# Patient Record
Sex: Female | Born: 2005 | Race: Black or African American | Hispanic: No | Marital: Single | Smoking: Never smoker
Health system: Southern US, Community
[De-identification: ages and names within clinical notes are randomized; demographics above are authoritative.]

## PROBLEM LIST (undated history)

## (undated) DIAGNOSIS — H9190 Unspecified hearing loss, unspecified ear: Secondary | ICD-10-CM

## (undated) DIAGNOSIS — H905 Unspecified sensorineural hearing loss: Secondary | ICD-10-CM

## (undated) DIAGNOSIS — F419 Anxiety disorder, unspecified: Secondary | ICD-10-CM

## (undated) DIAGNOSIS — J45909 Unspecified asthma, uncomplicated: Secondary | ICD-10-CM

## (undated) HISTORY — DX: Anxiety disorder, unspecified: F41.9

---
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
## 2023-03-17 ENCOUNTER — Ambulatory Visit: Payer: 59 | Admitting: Family

## 2023-03-17 ENCOUNTER — Encounter: Payer: Self-pay | Admitting: Family

## 2023-03-17 VITALS — BP 119/78 | HR 94 | Temp 98.0°F | Ht 65.0 in | Wt 171.4 lb

## 2023-03-17 DIAGNOSIS — N926 Irregular menstruation, unspecified: Secondary | ICD-10-CM

## 2023-03-17 DIAGNOSIS — J4520 Mild intermittent asthma, uncomplicated: Secondary | ICD-10-CM | POA: Insufficient documentation

## 2023-03-17 DIAGNOSIS — H905 Unspecified sensorineural hearing loss: Secondary | ICD-10-CM | POA: Insufficient documentation

## 2023-03-17 NOTE — Progress Notes (Signed)
New Patient Office Visit  Subjective:  Patient ID: Sandra Mccall, female    DOB: 12/14/2005  Age: 17 y.o. MRN: [id number]  CC:  Chief Complaint  Patient presents with   New Patient (Initial Visit)   Menstrual Problem    Pt c/o spaced out cycles,Present since the beginning of the year. Pt would like to discuss PCOS sx.    HPI Sandra Mccall presents for establishing care today.  *Discussed the use of AI scribe software for clinical note transcription with the patient, who gave verbal consent to proceed.  History of Present Illness   The patient, a junior at [employer], presents to establish care, and with concerns about irregular menstrual cycles and weight fluctuations. She reports that her menstrual cycle has been irregular since the beginning of the year, with periods arriving late or skipping a month. She also notes a fluctuation in weight, losing and gaining the same five pounds. The patient mentions experiencing facial hair growth under her chin, which she believes could be related to PCOS. She reports never being on birth control in past, has only had one sexual encounter and used condom, no hx of PAP, has had HPV vaccines.  In addition to these concerns, the patient has a history of auditory processing disorder since about 1st grade and has been using hearing aids since around the seventh grade. The hearing loss is described as a sensory issue, not related to infections or family history. The patient reports that she can hear but has difficulty processing certain sounds, and the hearing aides help a lot but she can still struggle at times with certain voices, especially males.  She is considering upgrading to Bluetooth-enabled hearing aids in the future.  The patient also has a history of asthma but reports no severe incidents. She uses a rescue inhaler as needed, with pollen identified as a trigger, denies cold or hot weather as triggers.      Assessment & Plan:      Irregular Menstrual Cycles - Recent irregularity in menstrual cycles with no associated pain or heavy bleeding. Possible Polycystic Ovary Syndrome (PCOS) suggested due to irregular cycles and facial hair growth & weight fluctuations.  -Reviewed PCOS s/s, dx, testing and treatment briefly, handout provided for dietary changes. -Refer to gynecology for further evaluation and possible workup.  -Can discuss birth control also if needed.  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 No recent changes or concerns. -Continue current management with hearing aids.  Asthma - Mild, with pollen identified as a trigger. No recent exacerbations. -Continue use of rescue inhaler as needed, no refill needed today.  General Health Maintenance -Encourage regular exercise, considering interval walking for cardiovascular health. -Continue monitoring for any changes in health status.    Subjective:    Outpatient Medications Prior to Visit  Medication Sig Dispense Refill   albuterol (VENTOLIN HFA) 108 (90 Base) MCG/ACT inhaler Inhale into the lungs.     No facility-administered medications prior to visit.   Past Medical History:  Diagnosis Date   Anxiety    Asthma    History reviewed. No pertinent surgical history.  Objective:   Today's Vitals: BP 119/78 (BP Location: Right Arm)   Pulse 94   Temp 98 F (36.7 C) (Temporal)   Ht 5\' 5"  (1.651 m)   Wt 171 lb 6 oz (77.7 kg)   LMP 02/22/2023 (Exact Date)   SpO2 99%   BMI 28.52 kg/m  Physical Exam Vitals and nursing note reviewed.  Constitutional:      Appearance: Normal appearance.  Cardiovascular:     Rate and Rhythm: Normal rate and regular rhythm.  Pulmonary:     Effort: Pulmonary effort is normal.     Breath sounds: Normal breath sounds.  Musculoskeletal:        General: Normal range of motion.  Skin:    General: Skin is warm and dry.  Neurological:     Mental Status: She  is alert.  Psychiatric:        Mood and Affect: Mood normal.        Behavior: Behavior normal.     No orders of the defined types were placed in this encounter.   Dulce Sellar, NP

## 2023-03-17 NOTE — Patient Instructions (Addendum)
Welcome to [facility], It was a pleasure meeting you today!   I have sent a referral to GYN to talk about PCOS in more detail. I did attach to this AVS some info on diet to treat PCOS which is good whether you actually have the condition or not!  Have a great week!    PLEASE NOTE: If you had any LAB tests please let us know if you have not heard back within a few days. You may see your results on MyChart before we have a chance to review them but we will give you a call once they are reviewed by [place]. If we ordered any REFERRALS today, please let us know if you have not heard from their office within the next week.  Let us know through MyChart if you are needing REFILLS, or have your pharmacy send [place] the request. You can also use MyChart to communicate with me or any office staff.  Please try these tips to maintain a healthy lifestyle: It is important that you exercise regularly at least 30 minutes 5 times a week. Think about what you will eat, plan ahead. Choose whole foods, & think  "clean, green, fresh or frozen" over canned, processed or packaged foods which are more sugary, salty, and fatty. 70 to 75% of food eaten should be fresh vegetables and protein. 2-3  meals daily with healthy snacks between meals, but must be whole fruit, protein or vegetables. Aim to eat over a 10 hour period when you are active, for example, 7am to 5pm, and then STOP after your last meal of the day, drinking only water.  Shorter eating windows, 6-8 hours, are showing benefits in heart disease and blood sugar regulation. Drink water every day! Shoot for 64 ounces daily = 8 cups, no other drink is as healthy! Fruit juice is best enjoyed in a healthy way, by EATING the fruit.

## 2023-05-27 ENCOUNTER — Telehealth: Payer: Self-pay | Admitting: *Deleted

## 2023-05-27 NOTE — Telephone Encounter (Signed)
Left patient a message with detailed appointment information.

## 2023-05-31 ENCOUNTER — Encounter: Payer: 59 | Admitting: Obstetrics and Gynecology

## 2023-08-30 ENCOUNTER — Encounter (HOSPITAL_COMMUNITY): Payer: Self-pay

## 2023-08-30 ENCOUNTER — Ambulatory Visit (HOSPITAL_COMMUNITY)
Admission: RE | Admit: 2023-08-30 | Discharge: 2023-08-30 | Disposition: A | Discharge status: Home / Self Care | Source: Ambulatory Visit | Attending: Family Medicine | Admitting: Family Medicine

## 2023-08-30 VITALS — BP 136/88 | HR 80 | Temp 99.2°F | Resp 16 | Ht 65.0 in | Wt 160.0 lb

## 2023-08-30 DIAGNOSIS — Z113 Encounter for screening for infections with a predominantly sexual mode of transmission: Secondary | ICD-10-CM | POA: Diagnosis present

## 2023-08-30 DIAGNOSIS — Z7251 High risk heterosexual behavior: Secondary | ICD-10-CM

## 2023-08-30 LAB — POCT URINE PREGNANCY: Preg Test, Ur: NEGATIVE

## 2023-08-30 LAB — HIV ANTIBODY (ROUTINE TESTING W REFLEX): HIV Screen 4th Generation wRfx: NONREACTIVE
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
## 2023-08-31 LAB — CERVICOVAGINAL ANCILLARY ONLY
Bacterial Vaginitis (gardnerella): NEGATIVE
Candida Glabrata: NEGATIVE
Candida Vaginitis: NEGATIVE
Chlamydia: NEGATIVE
Comment: NEGATIVE
Comment: NEGATIVE
Comment: NEGATIVE
Comment: NEGATIVE
Comment: NEGATIVE
Comment: NORMAL
Neisseria Gonorrhea: NEGATIVE
Trichomonas: NEGATIVE

## 2023-08-31 LAB — RPR: RPR Ser Ql: NONREACTIVE

## 2023-10-25 ENCOUNTER — Emergency Department (HOSPITAL_COMMUNITY)

## 2023-10-25 ENCOUNTER — Emergency Department (HOSPITAL_COMMUNITY)
Admission: EM | Admit: 2023-10-25 | Discharge: 2023-10-25 | Discharge status: Against Medical Advice | Attending: Emergency Medicine | Admitting: Emergency Medicine

## 2023-10-25 ENCOUNTER — Other Ambulatory Visit: Payer: Self-pay

## 2023-10-25 DIAGNOSIS — R202 Paresthesia of skin: Secondary | ICD-10-CM | POA: Diagnosis not present

## 2023-10-25 DIAGNOSIS — Y900 Blood alcohol level of less than 20 mg/100 ml: Secondary | ICD-10-CM | POA: Diagnosis not present

## 2023-10-25 DIAGNOSIS — Z5321 Procedure and treatment not carried out due to patient leaving prior to being seen by health care provider: Secondary | ICD-10-CM | POA: Diagnosis not present

## 2023-10-25 DIAGNOSIS — R531 Weakness: Secondary | ICD-10-CM | POA: Diagnosis not present

## 2023-10-25 DIAGNOSIS — M79642 Pain in left hand: Secondary | ICD-10-CM | POA: Diagnosis present

## 2023-10-25 DIAGNOSIS — R200 Anesthesia of skin: Secondary | ICD-10-CM | POA: Insufficient documentation

## 2023-10-25 LAB — ETHANOL: Alcohol, Ethyl (B): <15 mg/dL (ref <15)

## 2023-10-25 LAB — DIFFERENTIAL
Abs Immature Granulocytes: 0 10*3/uL (ref 0.00–0.07)
Basophils Absolute: 0 10*3/uL (ref 0.0–0.1)
Basophils Relative: 1 %
Eosinophils Absolute: 0.2 10*3/uL (ref 0.0–0.5)
Eosinophils Relative: 3 %
Immature Granulocytes: 0 %
Lymphocytes Relative: 36 %
Lymphs Abs: 1.7 10*3/uL (ref 0.7–4.0)
Monocytes Absolute: 0.4 10*3/uL (ref 0.1–1.0)
Monocytes Relative: 8 %
Neutro Abs: 2.6 10*3/uL (ref 1.7–7.7)
Neutrophils Relative %: 52 %

## 2023-10-25 LAB — I-STAT CHEM 8, ED
BUN: 6 mg/dL (ref 6–20)
Calcium, Ion: 1.21 mmol/L (ref 1.15–1.40)
Chloride: 104 mmol/L (ref 98–111)
Creatinine, Ser: 0.9 mg/dL (ref 0.44–1.00)
Glucose, Bld: 82 mg/dL (ref 70–99)
HCT: 40 % (ref 36.0–46.0)
Hemoglobin: 13.6 g/dL (ref 12.0–15.0)
Potassium: 4 mmol/L (ref 3.5–5.1)
Sodium: 138 mmol/L (ref 135–145)
TCO2: 23 mmol/L (ref 22–32)

## 2023-10-25 LAB — COMPREHENSIVE METABOLIC PANEL WITH GFR
ALT: 18 U/L (ref 0–44)
AST: 18 U/L (ref 15–41)
Albumin: 4 g/dL (ref 3.5–5.0)
Alkaline Phosphatase: 44 U/L (ref 38–126)
Anion gap: 10 (ref 5–15)
BUN: 7 mg/dL (ref 6–20)
CO2: 23 mmol/L (ref 22–32)
Calcium: 9.5 mg/dL (ref 8.9–10.3)
Chloride: 105 mmol/L (ref 98–111)
Creatinine, Ser: 0.77 mg/dL (ref 0.44–1.00)
GFR, Estimated: >60 mL/min (ref >60)
Glucose, Bld: 80 mg/dL (ref 70–99)
Potassium: 3.8 mmol/L (ref 3.5–5.1)
Sodium: 138 mmol/L (ref 135–145)
Total Bilirubin: 0.8 mg/dL (ref 0.0–1.2)
Total Protein: 6.9 g/dL (ref 6.5–8.1)

## 2023-10-25 LAB — CBC
HCT: 37.2 % (ref 36.0–46.0)
Hemoglobin: 11.6 g/dL — ABNORMAL LOW (ref 12.0–15.0)
MCH: 26.1 pg (ref 26.0–34.0)
MCHC: 31.2 g/dL (ref 30.0–36.0)
MCV: 83.6 fL (ref 80.0–100.0)
Platelets: 346 10*3/uL (ref 150–400)
RBC: 4.45 MIL/uL (ref 3.87–5.11)
RDW: 15.4 % (ref 11.5–15.5)
WBC: 4.9 10*3/uL (ref 4.0–10.5)
nRBC: 0 % (ref 0.0–0.2)

## 2023-10-25 LAB — APTT: aPTT: 30 s (ref 24–36)

## 2023-10-25 LAB — PROTIME-INR
INR: 1.1 (ref 0.8–1.2)
Prothrombin Time: 14.3 s (ref 11.4–15.2)

## 2023-10-25 LAB — CBG MONITORING, ED: Glucose-Capillary: 73 mg/dL (ref 70–99)

## 2023-10-25 LAB — HCG, SERUM, QUALITATIVE: Preg, Serum: NEGATIVE

## 2023-10-25 MED ORDER — SODIUM CHLORIDE 0.9% FLUSH: 3.0000 mL | Freq: Once | INTRAVENOUS | Status: DC

## 2023-10-25 NOTE — ED Notes (Signed)
 Called pt x3 for room, no response.

## 2023-10-25 NOTE — ED Provider Triage Note (Signed)
 Emergency Medicine Provider Triage Evaluation Note  Sandra Mccall , a 18 y.o. female  was evaluated in triage.  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  Reports that the pain and tingling stopped whenever she stopped using it.  When she went to bed she said she woke up and felt a muscle spasm on the volar aspect of her left forearm and felt the similar symptoms but went back to sleep.  Reports that she woke up around 0900 and felt weak into the arm.  Reports that she also felt weakness in her foot as well however denies any trouble walking.  Reports that she felt numbness and tingling however sensations intact in bilateral upper and lower extremities.  She is no focal neurodeficit.  Patient did have some auditory neuropathy and has hearing loss but this is not new.  Denies any chest pain or shortness of breath.  She reports that she googled her symptoms as concern for diabetes, stroke, or kidney disease.  Review of Systems  Positive:  Negative:   Physical Exam  BP (!) 124/91 (BP Location: Right Arm)   Pulse 89   Temp 98.4 F (36.9 C) (Oral)   Resp 17   Ht 5\' 5"  (1.651 m)   Wt 74.8 kg   LMP 10/08/2023   SpO2 100%   BMI 27.46 kg/m  Gen:   Awake, no distress  Resp:  Normal effort  MSK:   Moves extremities without difficulty  Other:  Other than cranial nerve VIII, cranial nerves are grossly intact.  Good strength with and without resistance and is symmetric in the upper and lower bilateral extremities in the knees, ankle, wrist, elbow, shoulder.  Palpable pulses throughout.  Compartments are soft.  Station reportedly intact and symmetric throughout the face, bilateral arms, and bilateral lower extremities.  She has tenderness to the medial epicondyle and tenderness to the brachial radialis muscle.  Medical Decision Making  Medically screening exam initiated at 1:52 PM.  Appropriate orders placed.   Sandra Mccall was informed that the remainder of the evaluation will be completed by another provider, this initial triage assessment does not replace that evaluation, and the importance of remaining in the ED until their evaluation is complete.  Nursing has ordered possible stroke workup. I do not think this is necessary, however it is already in process. She has no focal weakness. Sensation reportedly intact and symmetric. Will add basic labs and she can be further worked up in the main ED if necessary. Symptoms not consistent with stroke.    Spence Dux, PA-C 10/25/23 1400

## 2023-10-25 NOTE — ED Triage Notes (Signed)
 Pt having numbness and tingling and weakness to left and leg since yesterday 1700. Neuro intact. Denies chest pain SOB. Grips equal and strong. Denies headache.

## 2024-02-16 ENCOUNTER — Encounter (HOSPITAL_COMMUNITY): Payer: Self-pay

## 2024-02-16 ENCOUNTER — Ambulatory Visit (HOSPITAL_COMMUNITY)
Admission: RE | Admit: 2024-02-16 | Discharge: 2024-02-16 | Disposition: A | Discharge status: Home / Self Care | Source: Ambulatory Visit | Attending: Physician Assistant | Admitting: Physician Assistant

## 2024-02-16 VITALS — BP 134/89 | HR 91 | Temp 98.9°F | Resp 16

## 2024-02-16 DIAGNOSIS — Z113 Encounter for screening for infections with a predominantly sexual mode of transmission: Secondary | ICD-10-CM | POA: Insufficient documentation

## 2024-02-16 HISTORY — DX: Unspecified sensorineural hearing loss: H90.5

## 2024-02-16 HISTORY — DX: Unspecified hearing loss, unspecified ear: H91.90

## 2024-02-16 LAB — HIV ANTIBODY (ROUTINE TESTING W REFLEX): HIV Screen 4th Generation wRfx: NONREACTIVE

## 2024-02-16 NOTE — ED Notes (Signed)
 No answer from lobby, patient access doesn't know where patient went.

## 2024-02-16 NOTE — ED Provider Notes (Signed)
 [facility]    CSN: [id number] Arrival date & time: 02/16/24  1839      History   Chief Complaint Chief Complaint  Patient presents with   appt 630p    HPI Sandra Mccall is a 18 y.o. female.   Patient presents today for STI screening.  She denies any symptoms including pelvic pain, abdominal pain, fever, nausea, vomiting, vaginal discharge.  She is sexually active with female partners but does not always use condoms.  She was last seen and screened for STIs in March 2025.  She has no concern for specific exposures.  She would like to complete STI panel.  She has no concern for pregnancy and declined urine pregnancy test today.  Denies any recent antibiotics in the past 90 days.    Past Medical History:  Diagnosis Date   Anxiety    Asthma    Auditory neuropathy    Hearing deficit     Patient Active Problem List   Diagnosis Date Noted   Mild intermittent asthma without complication 03/17/2023   Auditory neuropathy, bilateral 03/17/2023    History reviewed. No pertinent surgical history.  OB History   No obstetric history on file.      Home Medications    Prior to Admission medications   Medication Sig Start Date End Date Taking? Authorizing Provider  albuterol (VENTOLIN HFA) 108 (90 Base) MCG/ACT inhaler Inhale into the lungs.    [provider]    Family History Family History  Problem Relation Age of Onset   Depression Mother    Diabetes Brother     Social History Social History   Tobacco Use   Smoking status: Never   Smokeless tobacco: Never  Substance Use Topics   Alcohol use: No   Drug use: No     Allergies   Pollen extract and Sulfa antibiotics   Review of Systems Review of Systems  Constitutional:  Negative for activity change, appetite change, fatigue and fever.  Gastrointestinal:  Negative for abdominal pain, nausea and vomiting.  Genitourinary:  Negative for dysuria, frequency, pelvic pain, urgency,  vaginal bleeding, vaginal discharge and vaginal pain.     Physical Exam Triage Vital Signs ED Triage Vitals  Encounter Vitals Group     BP 02/16/24 1913 134/89     Girls Systolic BP Percentile --      Girls Diastolic BP Percentile --      Boys Systolic BP Percentile --      Boys Diastolic BP Percentile --      Pulse Rate 02/16/24 1913 91     Resp 02/16/24 1913 16     Temp 02/16/24 1913 98.9 F (37.2 C)     Temp Source 02/16/24 1913 Oral     SpO2 02/16/24 1913 99 %     Weight --      Height --      Head Circumference --      Peak Flow --      Pain Score 02/16/24 1911 0     Pain Loc --      Pain Education --      Exclude from Growth Chart --    No data found.  Updated Vital Signs BP 134/89 (BP Location: Right Arm)   Pulse 91   Temp 98.9 F (37.2 C) (Oral)   Resp 16   LMP 01/16/2024 (Exact Date)   SpO2 99%   Visual Acuity Right Eye Distance:   Left Eye Distance:   Bilateral  Distance:    Right Eye Near:   Left Eye Near:    Bilateral Near:     Physical Exam Vitals reviewed.  Constitutional:      General: She is awake. She is not in acute distress.    Appearance: Normal appearance. She is well-developed. She is not ill-appearing.     Comments: Very pleasant female appears stated age in no acute distress sitting comfortably in exam room  HENT:     Head: Normocephalic and atraumatic.  Cardiovascular:     Rate and Rhythm: Normal rate and regular rhythm.     Heart sounds: Normal heart sounds, S1 normal and S2 normal. No murmur heard. Pulmonary:     Effort: Pulmonary effort is normal.     Breath sounds: Normal breath sounds. No wheezing, rhonchi or rales.     Comments: Clear to auscultation bilaterally Abdominal:     Palpations: Abdomen is soft.     Tenderness: There is no abdominal tenderness. There is no right CVA tenderness, left CVA tenderness, guarding or rebound.     Comments: Benign abdominal exam  Psychiatric:        Behavior: Behavior is cooperative.       UC Treatments / Results  Labs (all labs ordered are listed, but only abnormal results are displayed) Labs Reviewed  HIV ANTIBODY (ROUTINE TESTING W REFLEX)  RPR  CERVICOVAGINAL ANCILLARY ONLY    EKG   Radiology No results found.  Procedures Procedures (including critical care time)  Medications Ordered in UC Medications - No data to display  Initial Impression / Assessment and Plan / UC Course  I have reviewed the triage vital signs and the nursing notes.  Pertinent labs & imaging results that were available during my care of the patient were reviewed by me and considered in my medical decision making (see chart for details).     Patient is well-appearing, afebrile, nontoxic, nontachycardic.  STI screening was obtained today and is pending.  Patient denies any symptoms discussed that if she develops any symptoms she should return for reevaluation.  Screening for HIV and syphilis was obtained but her last sexual encounter was a few days ago.  We discussed that if she has any flulike illness or any specific concerns she should return at 4 weeks and 3 months for additional screening.  Discussed the importance of safe sex practices.  Strict return precautions given.  All questions answered patient satisfaction.  Final Clinical Impressions(s) / UC Diagnoses   Final diagnoses:  Screening examination for STI     Discharge Instructions      Monitor your MyChart for results.  We will contact you if anything is positive.  Abstain from sex until you receive results.  If you develop any symptoms including pelvic pain, abdominal pain, fever, nausea, vomiting, abnormal discharge, genital sore you should return for reevaluation.     ED Prescriptions   None    PDMP not reviewed this encounter.   Sherrell Rocky POUR, PA-C 02/16/24 1932

## 2024-02-16 NOTE — ED Triage Notes (Signed)
 Pt requesting STD testing. Denies any s/s or exposure.

## 2024-02-16 NOTE — Discharge Instructions (Signed)
 Monitor your MyChart for results.  We will contact you if anything is positive.  Abstain from sex until you receive results.  If you develop any symptoms including pelvic pain, abdominal pain, fever, nausea, vomiting, abnormal discharge, genital sore you should return for reevaluation.

## 2024-02-17 LAB — CERVICOVAGINAL ANCILLARY ONLY
Chlamydia: NEGATIVE
Comment: NEGATIVE
Comment: NEGATIVE
Comment: NORMAL
Neisseria Gonorrhea: NEGATIVE
Trichomonas: NEGATIVE

## 2024-02-17 LAB — RPR: RPR Ser Ql: NONREACTIVE

## 2024-03-15 ENCOUNTER — Ambulatory Visit
Admission: RE | Admit: 2024-03-15 | Discharge: 2024-03-15 | Disposition: A | Discharge status: Home / Self Care | Source: Ambulatory Visit | Attending: Nurse Practitioner | Admitting: Nurse Practitioner

## 2024-03-15 VITALS — BP 131/84 | HR 95 | Temp 98.3°F | Resp 18 | Wt 164.9 lb

## 2024-03-15 DIAGNOSIS — R197 Diarrhea, unspecified: Secondary | ICD-10-CM

## 2024-03-15 DIAGNOSIS — A084 Viral intestinal infection, unspecified: Secondary | ICD-10-CM | POA: Diagnosis not present

## 2024-03-15 LAB — POCT URINE DIPSTICK
Bilirubin, UA: NEGATIVE
Glucose, UA: NEGATIVE mg/dL
Ketones, POC UA: NEGATIVE mg/dL
Leukocytes, UA: NEGATIVE
Nitrite, UA: NEGATIVE
POC PROTEIN,UA: 30 — AB
Spec Grav, UA: 1.025 (ref 1.010–1.025)
Urobilinogen, UA: 0.2 U/dL
pH, UA: 5.5 (ref 5.0–8.0)

## 2024-03-15 MED ORDER — ONDANSETRON 8 MG PO TBDP: 8.0000 mg | ORAL_TABLET | Freq: Three times a day (TID) | ORAL | 0 refills | Status: AC | PRN

## 2024-03-15 NOTE — ED Provider Notes (Signed)
 [facility]    CSN: [id number] Arrival date & time: 03/15/24  1048      History   Chief Complaint Chief Complaint  Patient presents with   Diarrhea    Entered by patient    HPI Sandra Mccall is a 18 y.o. female.   Discussed the use of AI scribe software for clinical note transcription with the patient, who gave verbal consent to proceed.   The patient presents with a one-week history of diarrhea accompanied by nausea. Symptoms began toward the end of last week and worsened over the weekend. She reports persistent loose stools, with the most recent episode occurring this morning. The patient notes decreased appetite, stating that it is difficult to eat a regular meal. She endorses nausea but denies vomiting, fever, chills, or body aches.  Symptoms began after eating [employer] chicken nuggets and french fries the previous day. She recently moved into her grandparents' house and denies any known sick contacts with similar symptoms. She has not noticed any blood in her stool and denies urinary complaints including dysuria or hematuria. She has not taken antibiotics in the past three months and reports no recent travel.  The following sections of the patient's history were reviewed and updated as appropriate: allergies, current medications, past family history, past medical history, past social history, past surgical history, and problem list.     Past Medical History:  Diagnosis Date   Anxiety    Asthma    Auditory neuropathy    Hearing deficit     Patient Active Problem List   Diagnosis Date Noted   Mild intermittent asthma without complication 03/17/2023   Auditory neuropathy, bilateral 03/17/2023    History reviewed. No pertinent surgical history.  OB History   No obstetric history on file.      Home Medications    Prior to Admission medications   Medication Sig Start Date End Date Taking? Authorizing Provider  ondansetron (ZOFRAN-ODT) 8  MG disintegrating tablet Take 1 tablet (8 mg total) by mouth every 8 (eight) hours as needed for nausea or vomiting. 03/15/24  Yes Merlina Marchena, FNP  albuterol (VENTOLIN HFA) 108 (90 Base) MCG/ACT inhaler Inhale into the lungs.    [provider]    Family History Family History  Problem Relation Age of Onset   Depression Mother    Diabetes Brother     Social History Social History   Tobacco Use   Smoking status: Never    Passive exposure: Never   Smokeless tobacco: Never  Substance Use Topics   Alcohol use: No   Drug use: No     Allergies   Pollen extract, Sulfa antibiotics, and Chocolate   Review of Systems Review of Systems  Constitutional:  Positive for appetite change (decreased but able to regular diet). Negative for chills and fever.  Gastrointestinal:  Positive for diarrhea and nausea. Negative for abdominal pain, blood in stool and vomiting.  Genitourinary:  Negative for dysuria and hematuria.  Musculoskeletal:  Negative for myalgias.  All other systems reviewed and are negative.    Physical Exam Triage Vital Signs ED Triage Vitals  Encounter Vitals Group     BP 03/15/24 1132 131/84     Girls Systolic BP Percentile --      Girls Diastolic BP Percentile --      Boys Systolic BP Percentile --      Boys Diastolic BP Percentile --      Pulse Rate 03/15/24 1132 95  Resp 03/15/24 1132 18     Temp 03/15/24 1132 98.3 F (36.8 C)     Temp Source 03/15/24 1132 Oral     SpO2 03/15/24 1132 98 %     Weight 03/15/24 1131 164 lb 14.5 oz (74.8 kg)     Height --      Head Circumference --      Peak Flow --      Pain Score 03/15/24 1128 2     Pain Loc --      Pain Education --      Exclude from Growth Chart --    No data found.  Updated Vital Signs BP 131/84 (BP Location: Left Arm)   Pulse 95   Temp 98.3 F (36.8 C) (Oral)   Resp 18   Wt 164 lb 14.5 oz (74.8 kg)   LMP 03/15/2024 (Exact Date)   SpO2 98%   BMI 27.44 kg/m   Visual  Acuity Right Eye Distance:   Left Eye Distance:   Bilateral Distance:    Right Eye Near:   Left Eye Near:    Bilateral Near:     Physical Exam Vitals reviewed.  Constitutional:      General: She is awake. She is not in acute distress.    Appearance: Normal appearance. She is well-developed. She is not ill-appearing, toxic-appearing or diaphoretic.  HENT:     Head: Normocephalic.     Right Ear: Decreased hearing noted.     Left Ear: Decreased hearing noted.     Nose: Nose normal.     Mouth/Throat:     Mouth: Mucous membranes are moist.  Eyes:     General: Vision grossly intact.     Conjunctiva/sclera: Conjunctivae normal.  Cardiovascular:     Rate and Rhythm: Normal rate and regular rhythm.     Heart sounds: Normal heart sounds.  Pulmonary:     Effort: Pulmonary effort is normal.     Breath sounds: Normal breath sounds and air entry.  Abdominal:     General: Bowel sounds are normal. There is no distension.     Palpations: Abdomen is soft.     Tenderness: There is no abdominal tenderness.  Musculoskeletal:        General: Normal range of motion.     Cervical back: Normal range of motion and neck supple.  Skin:    General: Skin is warm and dry.  Neurological:     General: No focal deficit present.     Mental Status: She is alert and oriented to person, place, and time.  Psychiatric:        Speech: Speech normal.        Behavior: Behavior is cooperative.      UC Treatments / Results  Labs (all labs ordered are listed, but only abnormal results are displayed) Labs Reviewed  POCT URINE DIPSTICK - Abnormal; Notable for the following components:      Result Value   Clarity, UA cloudy (*)    Blood, UA large (*)    POC PROTEIN,UA =30 (*)    All other components within normal limits    EKG   Radiology No results found.  Procedures Procedures (including critical care time)  Medications Ordered in UC Medications - No data to display  Initial Impression /  Assessment and Plan / UC Course  I have reviewed the triage vital signs and the nursing notes.  Pertinent labs & imaging results that were available during my care of the  patient were reviewed by me and considered in my medical decision making (see chart for details).     The patient presents with acute onset of nausea and diarrhea, most consistent with viral gastroenteritis, likely norovirus. Exam and history do not suggest a surgical abdomen or other emergent cause. UA with cloudy urine but clear without any signs of dehydration or infection. Blood noted as well but patient reports that she started her cycle today. Supportive care was discussed, including clear liquids for the next 24 hours while avoiding red beverages, popsicles, or gelatin products. The patient may then advance to a bland diet as tolerated, starting with bananas, rice, applesauce, and toast, while avoiding dairy, fried, greasy, or spicy foods. Gradual return to a normal diet is appropriate as symptoms improve. Emergency precautions were reviewed, including inability to tolerate fluids, persistent vomiting, signs of dehydration, severe abdominal pain, bloody emesis, or high fever.  Today's evaluation has revealed no signs of a dangerous process. Discussed diagnosis with patient and/or guardian. Patient and/or guardian aware of their diagnosis, possible red flag symptoms to watch out for and need for close follow up. Patient and/or guardian understands verbal and written discharge instructions. Patient and/or guardian comfortable with plan and disposition.  Patient and/or guardian has a clear mental status at this time, good insight into illness (after discussion and teaching) and has clear judgment to make decisions regarding their care  Documentation was completed with the aid of voice recognition software. Transcription may contain typographical errors.   Final Clinical Impressions(s) / UC Diagnoses   Final diagnoses:   Diarrhea, unspecified type  Viral gastroenteritis     Discharge Instructions      You were seen today for nausea and diarrhea, which is most likely caused by a stomach virus (such as norovirus). This type of illness usually gets better on its own in a few days. For the next 24 hours, stick with clear liquids like water, Pedialyte, broth, or clear sodas. Do not drink anything red, including red popsicles or red Jell-O, because it can make it hard to tell if you are vomiting blood. After 24 hours, you can start eating bland foods such as bananas, rice, applesauce, and toast. Avoid dairy, fried, greasy, or spicy foods, until you are feeling better. You can slowly return to your normal diet as your symptoms improve. Go to the emergency room if you cannot keep down fluids, if vomiting does not stop, if you develop severe stomach pain, high fever, blood in your vomit, or signs of dehydration such as dizziness, fainting, or not urinating.      ED Prescriptions     Medication Sig Dispense Auth. Provider   ondansetron (ZOFRAN-ODT) 8 MG disintegrating tablet Take 1 tablet (8 mg total) by mouth every 8 (eight) hours as needed for nausea or vomiting. 12 tablet Iola Lukes, FNP      PDMP not reviewed this encounter.   Iola Lukes, [place] 03/15/24 1239

## 2024-03-15 NOTE — ED Triage Notes (Signed)
 Pt presents c/o diarrhea x 6 days. Pt reports last Thursday or Friday she started to get a nauseous feeling. Then Monday and Tuesday the sxs got worse with diarrhea. Pt states she believes she has a stomach bug. Pt also reports after having BM her stomach was hard.

## 2024-03-15 NOTE — Discharge Instructions (Addendum)
 You were seen today for nausea and diarrhea, which is most likely caused by a stomach virus (such as norovirus). This type of illness usually gets better on its own in a few days. For the next 24 hours, stick with clear liquids like water, Pedialyte, broth, or clear sodas. Do not drink anything red, including red popsicles or red Jell-O, because it can make it hard to tell if you are vomiting blood. After 24 hours, you can start eating bland foods such as bananas, rice, applesauce, and toast. Avoid dairy, fried, greasy, or spicy foods, until you are feeling better. You can slowly return to your normal diet as your symptoms improve. Go to the emergency room if you cannot keep down fluids, if vomiting does not stop, if you develop severe stomach pain, high fever, blood in your vomit, or signs of dehydration such as dizziness, fainting, or not urinating.

## 2024-03-17 ENCOUNTER — Encounter: Admitting: Family

## 2024-04-07 ENCOUNTER — Other Ambulatory Visit (HOSPITAL_COMMUNITY)
Admission: RE | Admit: 2024-04-07 | Discharge: 2024-04-07 | Disposition: A | Discharge status: Home / Self Care | Source: Ambulatory Visit | Attending: Family | Admitting: Family

## 2024-04-07 ENCOUNTER — Ambulatory Visit: Admitting: Family

## 2024-04-07 ENCOUNTER — Encounter: Payer: Self-pay | Admitting: Family

## 2024-04-07 VITALS — BP 122/80 | HR 74 | Temp 97.3°F | Ht 65.0 in | Wt 163.2 lb

## 2024-04-07 DIAGNOSIS — J4520 Mild intermittent asthma, uncomplicated: Secondary | ICD-10-CM | POA: Diagnosis not present

## 2024-04-07 DIAGNOSIS — Z23 Encounter for immunization: Secondary | ICD-10-CM | POA: Diagnosis not present

## 2024-04-07 DIAGNOSIS — Z13220 Encounter for screening for lipoid disorders: Secondary | ICD-10-CM | POA: Diagnosis not present

## 2024-04-07 DIAGNOSIS — Z01419 Encounter for gynecological examination (general) (routine) without abnormal findings: Secondary | ICD-10-CM | POA: Diagnosis present

## 2024-04-07 DIAGNOSIS — Z1159 Encounter for screening for other viral diseases: Secondary | ICD-10-CM

## 2024-04-07 DIAGNOSIS — Z0000 Encounter for general adult medical examination without abnormal findings: Secondary | ICD-10-CM | POA: Diagnosis not present

## 2024-04-07 LAB — COMPREHENSIVE METABOLIC PANEL WITH GFR
ALT: 12 U/L (ref 0–35)
AST: 14 U/L (ref 0–37)
Albumin: 4.6 g/dL (ref 3.5–5.2)
Alkaline Phosphatase: 62 U/L (ref 39–117)
BUN: 7 mg/dL (ref 6–23)
CO2: 26 meq/L (ref 19–32)
Calcium: 9.5 mg/dL (ref 8.4–10.5)
Chloride: 104 meq/L (ref 96–112)
Creatinine, Ser: 0.8 mg/dL (ref 0.40–1.20)
GFR: 106.23 mL/min (ref >60.00)
Glucose, Bld: 70 mg/dL (ref 70–99)
Potassium: 3.7 meq/L (ref 3.5–5.1)
Sodium: 138 meq/L (ref 135–145)
Total Bilirubin: 0.5 mg/dL (ref 0.2–1.2)
Total Protein: 7.6 g/dL (ref 6.0–8.3)

## 2024-04-07 LAB — CBC WITH DIFFERENTIAL/PLATELET
Basophils Absolute: 0 K/uL (ref 0.0–0.1)
Basophils Relative: 1.1 % (ref 0.0–3.0)
Eosinophils Absolute: 0.1 K/uL (ref 0.0–0.7)
Eosinophils Relative: 1.6 % (ref 0.0–5.0)
HCT: 36.2 % (ref 36.0–46.0)
Hemoglobin: 11.7 g/dL — ABNORMAL LOW (ref 12.0–15.0)
Lymphocytes Relative: 30.5 % (ref 12.0–46.0)
Lymphs Abs: 1.3 K/uL (ref 0.7–4.0)
MCHC: 32.3 g/dL (ref 30.0–36.0)
MCV: 80.8 fl (ref 78.0–100.0)
Monocytes Absolute: 0.3 K/uL (ref 0.1–1.0)
Monocytes Relative: 6.9 % (ref 3.0–12.0)
Neutro Abs: 2.6 K/uL (ref 1.4–7.7)
Neutrophils Relative %: 59.9 % (ref 43.0–77.0)
Platelets: 367 K/uL (ref 150.0–400.0)
RBC: 4.48 Mil/uL (ref 3.87–5.11)
RDW: 15.9 % — ABNORMAL HIGH (ref 11.5–14.6)
WBC: 4.3 K/uL — ABNORMAL LOW (ref 4.5–10.5)

## 2024-04-07 LAB — LIPID PANEL
Cholesterol: 176 mg/dL (ref 0–200)
HDL: 70.7 mg/dL (ref >39.00)
LDL Cholesterol: 96 mg/dL (ref 0–99)
NonHDL: 104.99
Total CHOL/HDL Ratio: 2
Triglycerides: 43 mg/dL (ref 0.0–149.0)
VLDL: 8.6 mg/dL (ref 0.0–40.0)

## 2024-04-07 LAB — TSH: TSH: 1.24 u[IU]/mL (ref 0.35–5.50)

## 2024-04-07 MED ORDER — ALBUTEROL SULFATE HFA 108 (90 BASE) MCG/ACT IN AERS
1.0000 | INHALATION_SPRAY | Freq: Four times a day (QID) | RESPIRATORY_TRACT | 2 refills | Status: AC | PRN
Start: 2024-04-07 — End: ?

## 2024-04-07 NOTE — Progress Notes (Signed)
 Phone [phone]  Subjective:   Patient is a 18 y.o. female presenting for annual physical.    Chief Complaint  Patient presents with   Annual Exam    Fasting w/ labs   Asthma    Medication refill of Albuterol inhaler.   Discussed the use of AI scribe software for clinical note transcription with the patient, who gave verbal consent to proceed.  History of Present Illness Sandra Mccall is a 18 year old female who presents for an annual physical exam.  She experiences fatigue due to a demanding schedule with school and work commitments, limiting her time for exercise. She has attempted to go to the gym but currently walks due to time and weather constraints. She uses an albuterol inhaler, though the frequency and dosage are unspecified. She recalls a previous emergency room visit in May, but no ongoing issues are noted. She suspects an allergy to chocolate but has not undergone allergen testing. She does not consume alcohol and avoids coffee, though she drinks other caffeinated beverages. Her bowel movements are regular, occurring at least every other day.  See problem oriented charting- ROS- full  review of systems was completed and negative except for what is noted in HPI above.  The following were reviewed and entered/updated in epic: Past Medical History:  Diagnosis Date   Anxiety    Asthma    Auditory neuropathy    Hearing deficit    Patient Active Problem List   Diagnosis Date Noted   Mild intermittent asthma without complication 03/17/2023   Auditory neuropathy, bilateral 03/17/2023   History reviewed. No pertinent surgical history.  Family History  Problem Relation Age of Onset   Depression Mother    Diabetes Brother     Medications- reviewed and updated Current Outpatient Medications  Medication Sig Dispense Refill   ondansetron (ZOFRAN-ODT) 8 MG disintegrating tablet Take 1 tablet (8 mg total) by mouth every 8 (eight) hours as needed for nausea or  vomiting. 12 tablet 0   albuterol (VENTOLIN HFA) 108 (90 Base) MCG/ACT inhaler Inhale 1-2 puffs into the lungs every 6 (six) hours as needed for wheezing or shortness of breath. 6.7 g 2   No current facility-administered medications for this visit.    Allergies-reviewed and updated Allergies  Allergen Reactions   Pollen Extract Shortness Of Breath   Sulfa Antibiotics Hives   Chocolate Hives    Social History   Social History Narrative   Not on file    Objective:  BP 122/80 (BP Location: Left Arm, Patient Position: Sitting, Cuff Size: Normal)   Pulse 74   Temp (!) 97.3 F (36.3 C) (Temporal)   Ht 5' 5 (1.651 m)   Wt 163 lb 3.2 oz (74 kg)   LMP 03/15/2024 (Exact Date)   BMI 27.16 kg/m  Physical Exam Vitals and nursing note reviewed. Exam conducted with a chaperone present.  Constitutional:      Appearance: Normal appearance.  HENT:     Head: Normocephalic.     Right Ear: Tympanic membrane normal.     Left Ear: Tympanic membrane normal.     Nose: Nose normal.     Mouth/Throat:     Mouth: Mucous membranes are moist.  Eyes:     Pupils: Pupils are equal, round, and reactive to light.  Cardiovascular:     Rate and Rhythm: Normal rate and regular rhythm.  Pulmonary:     Effort: Pulmonary effort is normal.     Breath sounds: Normal breath sounds.  Genitourinary:    Exam position: Lithotomy position.     Pubic Area: No rash or pubic lice.      Labia:        Right: No rash.        Left: No rash.      Vagina: Normal.     Cervix: Normal.     Comments: PAP smear specimen collected. Musculoskeletal:        General: Normal range of motion.     Cervical back: Normal range of motion.  Lymphadenopathy:     Cervical: No cervical adenopathy.  Skin:    General: Skin is warm and dry.  Neurological:     Mental Status: She is alert.  Psychiatric:        Mood and Affect: Mood normal.        Behavior: Behavior normal.      Assessment and Plan   Health Maintenance  counseling: 1. Anticipatory guidance: Patient counseled regarding regular dental exams q6 months, eye exams,  avoiding smoking and second hand smoke, limiting alcohol to 1 beverage per day, no illicit drugs.   2. Risk factor reduction:  Advised patient of need for regular exercise and diet rich with fruits and vegetables to reduce risk of heart attack and stroke. Wt Readings from Last 3 Encounters:  04/07/24 163 lb 3.2 oz (74 kg)  03/15/24 164 lb 14.5 oz (74.8 kg)  10/25/23 165 lb (74.8 kg) (90%, Z= 1.27)*   * Growth percentiles are based on CDC (Girls, 2-20 Years) data.   3. Immunizations/screenings/ancillary studies Immunization History  Administered Date(s) Administered   DTaP 03/31/2004, 05/26/2004, 07/28/2004, 07/27/2005, 01/30/2009   HIB (PRP-OMP) 03/31/2004, 05/26/2004, 07/27/2005   HPV 9-valent 03/28/2015, 05/22/2016   Hepatitis A, Ped/Adol-2 Dose 01/23/2005, 07/27/2005   Hepatitis B, PED/ADOLESCENT 17-Apr-2004, 02/26/2004, 10/24/2004   Influenza, Seasonal, Injecte, Preservative Fre 04/07/2024   MMR 01/23/2005, 01/30/2009   MenQuadfi_Meningococcal Groups ACYW Conjugate 03/28/2015, 02/06/2020   Meningococcal B, OMV 02/06/2020, 03/31/2021   PFIZER(Purple Top)SARS-COV-2 Vaccination 01/04/2020, 01/25/2020   Pneumococcal Conjugate-13 03/31/2004   Tdap 03/28/2015   There are no preventive care reminders to display for this patient.  4. Cervical cancer screening: never, performing today. 5. Skin cancer screening- advised regular sunscreen use. Denies worrisome, changing, or new skin lesions.  6. Birth control/STD check: none/STD testing done recently 7. Smoking associated screening: non- smoker 8. Alcohol screening: none 9. Exercise: walking, has gym can go to  Assessment & Plan Adult Wellness Visit Routine wellness visit focused on stress management and sleep improvement through lifestyle changes. - Perform physical examination including thyroid  and cardiovascular assessment. -  Conduct Pap smear. - Order hepatitis C screening. - Encourage regular exercise, aiming for 20-30 minute sessions. - Advise high fiber diet and adequate hydration.  Mild intermittent asthma Controlled with Albuterol inhaler prn.  - Sending refill today. - F/U prn  Possible food allergy (chocolate) Suspected chocolate allergy with symptoms suggestive of allergic reaction. - Consider referral to allergist for comprehensive food allergen testing.  General Health Maintenance Discussed hydration, caffeine limitation, and legal drinking age. - Encourage healthy diet, no processed foods, cardio exercise daily. - Advise water intake of 2 to 2.5 liters per day. - Counsel on limiting caffeine intake. - Advise abstinence from alcohol until legal age.  Recommended follow up:  Return for any future concerns, Complete physical w/fasting labs. No future appointments.  Lab/Order associations:fasting    Lucius Krabbe, NP

## 2024-04-07 NOTE — Patient Instructions (Addendum)
 It was very nice to see you today!   I will review your lab results via MyChart in a few days.   You look great! Stay well! Exercise more :-)    PLEASE NOTE:  If you had any lab tests please let us  know if you have not heard back within a few days. You may see your results on MyChart before we have a chance to review them but we will give you a call once they are reviewed by us . If we ordered any referrals today, please let us  know if you have not heard from their office within the next week.

## 2024-04-08 LAB — HEPATITIS C ANTIBODY: Hepatitis C Ab: NONREACTIVE

## 2024-04-10 LAB — CYTOLOGY - PAP: Diagnosis: NEGATIVE

## 2024-04-11 ENCOUNTER — Ambulatory Visit: Payer: Self-pay | Admitting: Family

## 2024-05-02 ENCOUNTER — Other Ambulatory Visit: Payer: Self-pay

## 2024-05-02 ENCOUNTER — Ambulatory Visit
Admission: RE | Admit: 2024-05-02 | Discharge: 2024-05-02 | Disposition: A | Discharge status: Home / Self Care | Source: Ambulatory Visit

## 2024-05-02 VITALS — BP 147/90 | HR 87 | Temp 98.1°F | Resp 18

## 2024-05-02 DIAGNOSIS — N760 Acute vaginitis: Secondary | ICD-10-CM | POA: Diagnosis present

## 2024-05-02 LAB — POCT URINE PREGNANCY: Preg Test, Ur: NEGATIVE

## 2024-05-02 NOTE — ED Triage Notes (Signed)
 Pt here for vaginal irritation; pt sts she thinks could be from using different brand of condom; pt denies discharge but sts ok to collect swab; pt also sts doesn't think she is pregnant but should test

## 2024-05-02 NOTE — ED Provider Notes (Signed)
 [facility]    CSN: [id number] Arrival date & time: 05/02/24  1451      History   Chief Complaint Chief Complaint  Patient presents with   Vaginal Discharge    Entered by patient   Vaginal Pain    HPI Sandra Mccall is a 18 y.o. female.   Pt presents today due to 2 days worth of vaginal irritation last week that has since resolved. Pt states that she is concerned that it was due to boyfriend changing the brand of condoms he used but is not sure. Pt denies malodorous vaginal discharge, pain with urination, or frequency. Pt denies concern for STIs but would like to be tested for them anyway, would also like be tested for pregnancy.   The history is provided by the patient.  Vaginal Discharge Vaginal Pain    Past Medical History:  Diagnosis Date   Anxiety    Asthma    Auditory neuropathy    Hearing deficit     Patient Active Problem List   Diagnosis Date Noted   Mild intermittent asthma without complication 03/17/2023   Auditory neuropathy, bilateral 03/17/2023    History reviewed. No pertinent surgical history.  OB History   No obstetric history on file.      Home Medications    Prior to Admission medications   Medication Sig Start Date End Date Taking? Authorizing Provider  albuterol  (VENTOLIN  HFA) 108 (90 Base) MCG/ACT inhaler Inhale 1-2 puffs into the lungs every 6 (six) hours as needed for wheezing or shortness of breath. 04/07/24   Lucius Krabbe, NP  ondansetron  (ZOFRAN -ODT) 8 MG disintegrating tablet Take 1 tablet (8 mg total) by mouth every 8 (eight) hours as needed for nausea or vomiting. 03/15/24   Iola Lukes, FNP    Family History Family History  Problem Relation Age of Onset   Depression Mother    Diabetes Brother     Social History Social History   Tobacco Use   Smoking status: Never    Passive exposure: Never   Smokeless tobacco: Never  Substance Use Topics   Alcohol use: No   Drug use: No      Allergies   Pollen extract, Sulfa antibiotics, and Chocolate   Review of Systems Review of Systems  Genitourinary:  Positive for vaginal discharge and vaginal pain.     Physical Exam Triage Vital Signs ED Triage Vitals [05/02/24 1507]  Encounter Vitals Group     BP (!) 147/90     Girls Systolic BP Percentile      Girls Diastolic BP Percentile      Boys Systolic BP Percentile      Boys Diastolic BP Percentile      Pulse Rate 87     Resp 18     Temp 98.1 F (36.7 C)     Temp Source Oral     SpO2 99 %     Weight      Height      Head Circumference      Peak Flow      Pain Score 0     Pain Loc      Pain Education      Exclude from Growth Chart    No data found.  Updated Vital Signs BP (!) 147/90 (BP Location: Left Arm)   Pulse 87   Temp 98.1 F (36.7 C) (Oral)   Resp 18   LMP 04/21/2024   SpO2 99%   Visual Acuity Right Eye  Distance:   Left Eye Distance:   Bilateral Distance:    Right Eye Near:   Left Eye Near:    Bilateral Near:     Physical Exam Vitals and nursing note reviewed.  Constitutional:      General: She is not in acute distress.    Appearance: Normal appearance. She is not ill-appearing, toxic-appearing or diaphoretic.  Eyes:     General: No scleral icterus. Cardiovascular:     Rate and Rhythm: Normal rate and regular rhythm.     Heart sounds: Normal heart sounds.  Pulmonary:     Effort: Pulmonary effort is normal. No respiratory distress.     Breath sounds: Normal breath sounds. No wheezing or rhonchi.  Abdominal:     General: Abdomen is flat. Bowel sounds are normal.     Palpations: Abdomen is soft.     Tenderness: There is abdominal tenderness in the suprapubic area. There is no right CVA tenderness or left CVA tenderness.  Skin:    General: Skin is warm.  Neurological:     Mental Status: She is alert and oriented to person, place, and time.  Psychiatric:        Mood and Affect: Mood normal.        Behavior: Behavior  normal.      UC Treatments / Results  Labs (all labs ordered are listed, but only abnormal results are displayed) Labs Reviewed  POCT URINE PREGNANCY - Normal  CERVICOVAGINAL ANCILLARY ONLY    EKG   Radiology No results found.  Procedures Procedures (including critical care time)  Medications Ordered in UC Medications - No data to display  Initial Impression / Assessment and Plan / UC Course  I have reviewed the triage vital signs and the nursing notes.  Pertinent labs & imaging results that were available during my care of the patient were reviewed by me and considered in my medical decision making (see chart for details).    Final Clinical Impressions(s) / UC Diagnoses   Final diagnoses:  Acute vaginitis     Discharge Instructions      Since you are not experiencing any symptoms today we will wait and see what returns on lab work, if anything comes back positive we can send treatment to your pharmacy.     ED Prescriptions   None    PDMP not reviewed this encounter.   Andra Corean BROCKS, PA-C 05/02/24 [phone]

## 2024-05-02 NOTE — Discharge Instructions (Signed)
 Since you are not experiencing any symptoms today we will wait and see what returns on lab work, if anything comes back positive we can send treatment to your pharmacy.

## 2024-05-03 LAB — CERVICOVAGINAL ANCILLARY ONLY
Bacterial Vaginitis (gardnerella): NEGATIVE
Candida Glabrata: NEGATIVE
Candida Vaginitis: NEGATIVE
Chlamydia: NEGATIVE
Comment: NEGATIVE
Comment: NEGATIVE
Comment: NEGATIVE
Comment: NEGATIVE
Comment: NEGATIVE
Comment: NORMAL
Neisseria Gonorrhea: NEGATIVE
Trichomonas: NEGATIVE
